# Patient Record
Sex: Female | Born: 1955
Health system: Southern US, Community
[De-identification: ages and names within clinical notes are randomized; demographics above are authoritative.]

## PROBLEM LIST (undated history)

## (undated) DIAGNOSIS — I10 Essential (primary) hypertension: Secondary | ICD-10-CM

## (undated) DIAGNOSIS — Z992 Dependence on renal dialysis: Secondary | ICD-10-CM

## (undated) HISTORY — PX: SHOULDER SURGERY: SHX246

---

## 2007-11-02 ENCOUNTER — Emergency Department (HOSPITAL_COMMUNITY): Admission: EM | Admit: 2007-11-02 | Discharge: 2007-11-02 | Payer: Self-pay | Admitting: Emergency Medicine

## 2007-11-04 ENCOUNTER — Emergency Department (HOSPITAL_COMMUNITY): Admission: EM | Admit: 2007-11-04 | Discharge: 2007-11-04 | Payer: Self-pay | Admitting: Emergency Medicine

## 2010-04-12 ENCOUNTER — Emergency Department (HOSPITAL_BASED_OUTPATIENT_CLINIC_OR_DEPARTMENT_OTHER): Admission: EM | Admit: 2010-04-12 | Discharge: 2010-04-13 | Payer: Self-pay | Admitting: Emergency Medicine

## 2010-07-30 ENCOUNTER — Emergency Department (HOSPITAL_BASED_OUTPATIENT_CLINIC_OR_DEPARTMENT_OTHER)
Admission: EM | Admit: 2010-07-30 | Discharge: 2010-07-30 | Payer: Self-pay | Source: Home / Self Care | Admitting: Emergency Medicine

## 2010-10-20 LAB — URINALYSIS, ROUTINE W REFLEX MICROSCOPIC
Glucose, UA: >1000 mg/dL — AB
Ketones, ur: NEGATIVE mg/dL
Leukocytes, UA: NEGATIVE
Nitrite: NEGATIVE
Protein, ur: 100 mg/dL — AB
pH: 5.5 (ref 5.0–8.0)

## 2010-10-20 LAB — URINE MICROSCOPIC-ADD ON

## 2010-10-20 LAB — COMPREHENSIVE METABOLIC PANEL
ALT: 14 U/L (ref 0–35)
AST: 15 U/L (ref 0–37)
Albumin: 3.6 g/dL (ref 3.5–5.2)
Alkaline Phosphatase: 124 U/L — ABNORMAL HIGH (ref 39–117)
Calcium: 9.1 mg/dL (ref 8.4–10.5)
GFR calc Af Amer: >60 mL/min (ref >60)
Potassium: 4.6 mEq/L (ref 3.5–5.1)
Sodium: 143 mEq/L (ref 135–145)
Total Protein: 6.6 g/dL (ref 6.0–8.3)

## 2010-10-20 LAB — GLUCOSE, CAPILLARY: Glucose-Capillary: 269 mg/dL — ABNORMAL HIGH (ref 70–99)

## 2010-10-23 LAB — CULTURE, ROUTINE-ABSCESS

## 2010-10-28 ENCOUNTER — Emergency Department (INDEPENDENT_AMBULATORY_CARE_PROVIDER_SITE_OTHER): Payer: Medicaid Other

## 2010-10-28 ENCOUNTER — Emergency Department (HOSPITAL_BASED_OUTPATIENT_CLINIC_OR_DEPARTMENT_OTHER)
Admission: EM | Admit: 2010-10-28 | Discharge: 2010-10-28 | Disposition: A | Discharge status: Home / Self Care | Payer: Medicaid Other | Attending: Emergency Medicine | Admitting: Emergency Medicine

## 2010-10-28 DIAGNOSIS — M549 Dorsalgia, unspecified: Secondary | ICD-10-CM

## 2010-10-28 DIAGNOSIS — E119 Type 2 diabetes mellitus without complications: Secondary | ICD-10-CM | POA: Insufficient documentation

## 2010-10-28 DIAGNOSIS — I10 Essential (primary) hypertension: Secondary | ICD-10-CM | POA: Insufficient documentation

## 2010-10-28 LAB — CBC
Hemoglobin: 12.3 g/dL (ref 12.0–15.0)
MCH: 26.2 pg (ref 26.0–34.0)
MCHC: 34.9 g/dL (ref 30.0–36.0)
RDW: 12.6 % (ref 11.5–15.5)

## 2010-10-28 LAB — DIFFERENTIAL
Basophils Absolute: 0.1 10*3/uL (ref 0.0–0.1)
Eosinophils Absolute: 0.1 10*3/uL (ref 0.0–0.7)
Lymphs Abs: 2.3 10*3/uL (ref 0.7–4.0)
Monocytes Absolute: 0.3 10*3/uL (ref 0.1–1.0)
Monocytes Relative: 6 % (ref 3–12)
Neutro Abs: 2.1 10*3/uL (ref 1.7–7.7)

## 2010-10-28 LAB — BASIC METABOLIC PANEL
CO2: 22 mEq/L (ref 19–32)
Calcium: 9.3 mg/dL (ref 8.4–10.5)
Creatinine, Ser: 0.8 mg/dL (ref 0.4–1.2)
GFR calc Af Amer: >60 mL/min (ref >60)
GFR calc non Af Amer: >60 mL/min (ref >60)

## 2010-10-28 LAB — POCT CARDIAC MARKERS

## 2011-04-24 ENCOUNTER — Emergency Department (HOSPITAL_BASED_OUTPATIENT_CLINIC_OR_DEPARTMENT_OTHER)
Admission: EM | Admit: 2011-04-24 | Discharge: 2011-04-25 | Disposition: A | Discharge status: Home / Self Care | Payer: Medicaid Other | Attending: Emergency Medicine | Admitting: Emergency Medicine

## 2011-04-24 ENCOUNTER — Encounter: Payer: Self-pay | Admitting: *Deleted

## 2011-04-24 DIAGNOSIS — L03119 Cellulitis of unspecified part of limb: Secondary | ICD-10-CM

## 2011-04-24 DIAGNOSIS — L02419 Cutaneous abscess of limb, unspecified: Secondary | ICD-10-CM | POA: Insufficient documentation

## 2011-04-24 HISTORY — DX: Essential (primary) hypertension: I10

## 2011-04-24 MED ORDER — LIDOCAINE HCL (PF) 1 % IJ SOLN
10.0000 mL | Freq: Once | INTRAMUSCULAR | Status: AC
  Administered 2011-04-24: 10 mL via INTRADERMAL
  Filled 2011-04-24 (×2): qty 5

## 2011-04-24 NOTE — ED Notes (Signed)
Abscess right lower leg x 2 days.

## 2011-04-25 MED ORDER — SULFAMETHOXAZOLE-TRIMETHOPRIM 800-160 MG PO TABS: 1.0000 | ORAL_TABLET | Freq: Two times a day (BID) | ORAL | Status: AC

## 2011-04-25 NOTE — ED Provider Notes (Signed)
History     CSN: [id number] Arrival date & time: 04/24/2011  9:27 PM   Chief Complaint  Patient presents with  . Abscess     (Include location/radiation/quality/duration/timing/severity/associated sxs/prior treatment) Patient is a 55 y.o. female presenting with abscess. The history is provided by the patient.  Abscess  This is a new problem. The current episode started yesterday. The onset was gradual. The problem occurs continuously. The problem has been rapidly worsening. The abscess is present on the right lower leg. The problem is moderate. The abscess is characterized by redness, painfulness and swelling. It is unknown what she was exposed to. The abscess first occurred at home. Pertinent negatives include no fever and no vomiting. Her past medical history does not include skin abscesses in family. There were no sick contacts. She has received no recent medical care.   H/o abscess and this feels the same as previous lesions.  Started as a pimple and is now larger and more painful to her R anterior shin  Past Medical History  Diagnosis Date  . Diabetes mellitus   . Hypertension      Past Surgical History  Procedure Date  . Cesarean section   . Shoulder surgery     No family history on file.  History  Substance Use Topics  . Smoking status: Never Smoker   . Smokeless tobacco: Not on file  . Alcohol Use: No    OB History    Grav Para Term Preterm Abortions TAB SAB Ect Mult Living                  Review of Systems  Constitutional: Negative for fever and chills.  HENT: Negative for neck pain and neck stiffness.   Eyes: Negative for pain.  Respiratory: Negative for shortness of breath.   Cardiovascular: Negative for chest pain.  Gastrointestinal: Negative for vomiting and abdominal pain.  Genitourinary: Negative for dysuria.  Musculoskeletal: Negative for back pain.  Skin: Positive for wound. Negative for rash.  Neurological: Negative for headaches.  All other  systems reviewed and are negative.    Allergies  Oxycontin  Home Medications   Current Outpatient Rx  Name Route Sig Dispense Refill  . LISINOPRIL 10 MG PO TABS Oral Take 10 mg by mouth daily.      Marland Kitchen METFORMIN HCL 500 MG PO TABS Oral Take 500 mg by mouth 2 (two) times daily with a meal.        Physical Exam    BP 157/69  Pulse 71  Temp(Src) 98.1 F (36.7 C) (Oral)  Resp 18  SpO2 98%  Physical Exam  Constitutional: She is oriented to person, place, and time. She appears well-developed and well-nourished.  HENT:  Head: Normocephalic and atraumatic.  Eyes: Conjunctivae and EOM are normal. Pupils are equal, round, and reactive to light.  Neck: Full passive range of motion without pain. Neck supple. No thyromegaly present.       No meningismus  Cardiovascular: Normal rate, regular rhythm, S1 normal, S2 normal and intact distal pulses.   Pulmonary/Chest: Effort normal and breath sounds normal.  Abdominal: Soft. Bowel sounds are normal. There is no tenderness. There is no CVA tenderness.  Musculoskeletal: Normal range of motion.  Neurological: She is alert and oriented to person, place, and time. She has normal strength. No cranial nerve deficit or sensory deficit. She displays a negative Romberg sign. GCS eye subscore is 4. GCS verbal subscore is 5. GCS motor subscore is 6.  Normal Gait  Skin: Skin is warm and dry. No cyanosis. Nails show no clubbing.       RLE: tender area to distal anterior shin with mild erythema and pointing with underlying fluctuance, no streaking erythema  Psychiatric: She has a normal mood and affect. Her speech is normal and behavior is normal.    ED Course  INCISION AND DRAINAGE Date/Time: 04/25/2011 12:28 AM Performed by: Sunnie Nielsen Authorized by: Sunnie Nielsen Consent: Verbal consent obtained. Risks and benefits: risks, benefits and alternatives were discussed Consent given by: patient Patient understanding: patient states understanding of  the procedure being performed Patient consent: the patient's understanding of the procedure matches consent given Procedure consent: procedure consent matches procedure scheduled Required items: required blood products, implants, devices, and special equipment available Patient identity confirmed: verbally with patient Time out: Immediately prior to procedure a "time out" was called to verify the correct patient, procedure, equipment, support staff and site/side marked as required. Type: abscess Location: R anterior distal tibia. Anesthesia: local infiltration Local anesthetic: lidocaine 1% without epinephrine Anesthetic total: 1 ml Risk factor: underlying major vessel and underlying major nerve Scalpel size: 11 Needle gauge: 22 Incision type: single straight Complexity: complex Drainage: purulent Drainage amount: moderate Wound treatment: wound left open Patient tolerance: Patient tolerated the procedure well with no immediate complications.    Results for orders placed during the hospital encounter of 10/28/10  DIFFERENTIAL      Component Value Range   Neutrophils Relative 42 (*) 43 - 77 (%)   Lymphocytes Relative 48 (*) 12 - 46 (%)   Monocytes Relative 6  3 - 12 (%)   Eosinophils Relative 3  0 - 5 (%)   Basophils Relative 1  0 - 1 (%)   Neutro Abs 2.1  1.7 - 7.7 (K/uL)   Lymphs Abs 2.3  0.7 - 4.0 (K/uL)   Monocytes Absolute 0.3  0.1 - 1.0 (K/uL)   Eosinophils Absolute 0.1  0.0 - 0.7 (K/uL)   Basophils Absolute 0.1  0.0 - 0.1 (K/uL)   Smear Review MORPHOLOGY UNREMARKABLE    CBC      Component Value Range   WBC 4.9  4.0 - 10.5 (K/uL)   RBC 4.70  3.87 - 5.11 (MIL/uL)   Hemoglobin 12.3  12.0 - 15.0 (g/dL)   HCT 16.1 (*) 09.6 - 46.0 (%)   MCV 74.9 (*) 78.0 - 100.0 (fL)   MCH 26.2  26.0 - 34.0 (pg)   MCHC 34.9  30.0 - 36.0 (g/dL)   RDW 04.5  40.9 - 81.1 (%)   Platelets 268  150 - 400 (K/uL)  BASIC METABOLIC PANEL      Component Value Range   Sodium 137  135 - 145  (mEq/L)   Potassium 4.6  3.5 - 5.1 (mEq/L)   Chloride 103  96 - 112 (mEq/L)   CO2 22  19 - 32 (mEq/L)   Glucose, Bld 360 (*) 70 - 99 (mg/dL)   BUN 16  6 - 23 (mg/dL)   Creatinine, Ser .8  0.4 - 1.2 (mg/dL)   Calcium 9.3  8.4 - 91.4 (mg/dL)   GFR calc non Af Amer >60  >60 (mL/min)   GFR calc Af Amer    >60 (mL/min)   Value: >60            The eGFR has been calculated     using the MDRD equation.     This calculation has not been     validated  in all clinical     situations.     eGFR's persistently     <60 mL/min signify     possible Chronic Kidney Disease.  D-DIMER, QUANTITATIVE      Component Value Range   D-Dimer, Quant    0.00 - 0.48 (ug/mL-FEU)   Value: 0.29            AT THE INHOUSE ESTABLISHED CUTOFF     VALUE OF 0.48 ug/mL FEU,     THIS ASSAY HAS BEEN DOCUMENTED     IN THE LITERATURE TO HAVE     A SENSITIVITY AND NEGATIVE     PREDICTIVE VALUE OF AT LEAST     98 TO 99%.  THE TEST RESULT     SHOULD BE CORRELATED WITH     AN ASSESSMENT OF THE CLINICAL     PROBABILITY OF DVT / VTE.  POCT CARDIAC MARKERS      Component Value Range   Myoglobin, poc 144  12 - 200 (ng/mL)   CKMB, poc 3.1  1.0 - 8.0 (ng/mL)   Troponin i, poc <0.05  0.00 - 0.09 (ng/mL)   Comment       Value:            TROPONIN VALUES IN THE RANGE     OF 0.00-0.09 ng/mL SHOW     NO INDICATION OF     MYOCARDIAL INJURY.                PERSISTENTLY INCREASED TROPONIN     VALUES IN THE RANGE OF 0.10-0.24     ng/mL CAN BE SEEN IN:           -UNSTABLE ANGINA           -CONGESTIVE HEART FAILURE           -MYOCARDITIS           -CHEST TRAUMA           -ARRYHTHMIAS           -LATE PRESENTING MI           -COPD       CLINICAL FOLLOW-UP RECOMMENDED.                TROPONIN VALUES >=0.25 ng/mL     INDICATE POSSIBLE MYOCARDIAL     ISCHEMIA. SERIAL TESTING     RECOMMENDED.   No results found.   No diagnosis found.   MDM R leg abscess with I/D as above. RX provided with d/c precautions and plan  outpatient management, return for fever, vomiting or worsening abscess       Sunnie Nielsen, MD 04/25/11 [phone]

## 2011-05-04 LAB — CULTURE, ROUTINE-ABSCESS

## 2012-03-06 ENCOUNTER — Encounter (HOSPITAL_BASED_OUTPATIENT_CLINIC_OR_DEPARTMENT_OTHER): Payer: Self-pay | Admitting: Emergency Medicine

## 2012-03-06 ENCOUNTER — Emergency Department (HOSPITAL_BASED_OUTPATIENT_CLINIC_OR_DEPARTMENT_OTHER)
Admission: EM | Admit: 2012-03-06 | Discharge: 2012-03-06 | Disposition: A | Discharge status: Home / Self Care | Payer: Medicaid Other | Attending: Emergency Medicine | Admitting: Emergency Medicine

## 2012-03-06 DIAGNOSIS — R21 Rash and other nonspecific skin eruption: Secondary | ICD-10-CM

## 2012-03-06 DIAGNOSIS — E119 Type 2 diabetes mellitus without complications: Secondary | ICD-10-CM | POA: Insufficient documentation

## 2012-03-06 DIAGNOSIS — I10 Essential (primary) hypertension: Secondary | ICD-10-CM | POA: Insufficient documentation

## 2012-03-06 DIAGNOSIS — L299 Pruritus, unspecified: Secondary | ICD-10-CM | POA: Insufficient documentation

## 2012-03-06 MED ORDER — HYDROXYZINE HCL 25 MG PO TABS: 25.0000 mg | ORAL_TABLET | Freq: Three times a day (TID) | ORAL | Status: AC | PRN

## 2012-03-06 NOTE — ED Provider Notes (Signed)
History     CSN: [id number]  Arrival date & time 03/06/12  4098   First MD Initiated Contact with Patient 03/06/12 [phone]      Chief Complaint  Patient presents with  . Rash    (Consider location/radiation/quality/duration/timing/severity/associated sxs/prior treatment) HPI Patient is a 73 your old female who presents today complaining of rash over the past month. She has seen her primary care physician for this. Patient describes the rash as being itchy. She has small raised lesions notable over the dorsum of her feet bilaterally as well as in scattered distribution over the bilateral lower legs, bilateral upper arms, and upper chest wall. These are blanching and patient reports terrible itching with these. She has tried Caladryl lotion without relief. Patient also was given steroids as well as steroid cream for her primary care doctor without relief. She has not been seen again since that time. Patient is tearful as she reports she has been unable to sleep because of the itching. She took Benadryl once with no relief. She denies shortness of breath, cough, fevers, headache, neck pain, GI symptoms, GU symptoms, or other concerns. There are no other associated or modifying factors. Past Medical History  Diagnosis Date  . Diabetes mellitus   . Hypertension     Past Surgical History  Procedure Date  . Cesarean section   . Shoulder surgery     History reviewed. No pertinent family history.  History  Substance Use Topics  . Smoking status: Never Smoker   . Smokeless tobacco: Not on file  . Alcohol Use: No    OB History    Grav Para Term Preterm Abortions TAB SAB Ect Mult Living                  Review of Systems  Constitutional: Negative.   HENT: Negative.   Eyes: Negative.   Respiratory: Negative.   Cardiovascular: Negative.   Gastrointestinal: Negative.   Genitourinary: Negative.   Musculoskeletal: Negative.   Skin: Positive for rash.  Neurological: Negative.     Hematological: Negative.   Psychiatric/Behavioral: Negative.   All other systems reviewed and are negative.    Allergies  Oxycodone hcl er  Home Medications   Current Outpatient Rx  Name Route Sig Dispense Refill  . HYDROXYZINE HCL 25 MG PO TABS Oral Take 1 tablet (25 mg total) by mouth every 8 (eight) hours as needed for itching. 12 tablet 0  . LISINOPRIL 10 MG PO TABS Oral Take 10 mg by mouth daily.      Marland Kitchen METFORMIN HCL 500 MG PO TABS Oral Take 500 mg by mouth 2 (two) times daily with a meal.        BP 145/64  Pulse 72  Temp 98.2 F (36.8 C) (Oral)  Resp 16  Ht 5\' 6"  (1.676 m)  Wt 193 lb (87.544 kg)  BMI 31.15 kg/m2  SpO2 100%  Physical Exam  Nursing note and vitals reviewed. GEN: Well-developed, well-nourished female in no distress HEENT: Atraumatic, normocephalic.  EYES: PERRLA BL, no scleral icterus. NECK: Trachea midline, no meningismus, full range of motion Neuro: cranial nerves grossly 2-12 intact, no abnormalities of strength or sensation, A and O x 3 MSK: Patient moves all 4 extremities symmetrically, no deformity, edema, or injury noted Skin: Patient with 1 mm in diameter raised slightly erythematous lesions over the dorsum of the feet bilaterally bilateral lower extremities below the knees and a more diffuse distribution, as well as scattered of the bilateral upper extremities  and right side upper chest wall. These or not in distribution consistent with clear contact dermatitis. Lesions are small and discrete with no erythema to suggest cellulitis. These are blanching and neither purpuric nor petechiae though there are some lesions have surrounding bruising. Patient reports that these have healed.  Psych: Patient is somewhat tearful.   ED Course  Procedures (including critical care time)  Labs Reviewed - No data to display No results found.   1. Rash and nonspecific skin eruption   2. Pruritus       MDM  Patient was evaluated by myself. Based on her  symptoms I did not feel that laboratory testing was necessary today. Patient's chief complaint was truly itching. She had some areas that appeared to have associated bruising over the bilateral lower extremities but these appeared to be bruises rather than petechiae or purpura. Patient was hemodynamically stable. She had driven herself here today and preferred to be able to drive herself home. A prescription was given for Atarax. Patient will try this. She is a diabetic and is already had one course of steroids without resolution. She did not have any symptoms to suggest Stevens-Johnson syndrome, TEN, or other emergent skin conditions today. I recommended that she contact her primary care physician tomorrow regarding whether they wanted to initiate a second round of steroids. Patient did not require any laboratory testing today. She was discharged in good condition with a prescription for hydroxyzine.        Cyndra Numbers, MD 03/06/12 1119

## 2012-03-06 NOTE — ED Notes (Signed)
Pt c/o rash to BLE, BUE & chest x 1 mo; OTC creams not helping

## 2016-04-02 ENCOUNTER — Emergency Department (HOSPITAL_BASED_OUTPATIENT_CLINIC_OR_DEPARTMENT_OTHER)
Admission: EM | Admit: 2016-04-02 | Discharge: 2016-04-02 | Disposition: A | Discharge status: Home / Self Care | Payer: Medicaid Other | Attending: Emergency Medicine | Admitting: Emergency Medicine

## 2016-04-02 ENCOUNTER — Emergency Department (HOSPITAL_BASED_OUTPATIENT_CLINIC_OR_DEPARTMENT_OTHER): Payer: Medicaid Other

## 2016-04-02 ENCOUNTER — Encounter (HOSPITAL_BASED_OUTPATIENT_CLINIC_OR_DEPARTMENT_OTHER): Payer: Self-pay | Admitting: *Deleted

## 2016-04-02 DIAGNOSIS — Y939 Activity, unspecified: Secondary | ICD-10-CM | POA: Insufficient documentation

## 2016-04-02 DIAGNOSIS — Y929 Unspecified place or not applicable: Secondary | ICD-10-CM | POA: Insufficient documentation

## 2016-04-02 DIAGNOSIS — S76112A Strain of left quadriceps muscle, fascia and tendon, initial encounter: Secondary | ICD-10-CM | POA: Insufficient documentation

## 2016-04-02 DIAGNOSIS — I10 Essential (primary) hypertension: Secondary | ICD-10-CM | POA: Insufficient documentation

## 2016-04-02 DIAGNOSIS — Z79899 Other long term (current) drug therapy: Secondary | ICD-10-CM | POA: Insufficient documentation

## 2016-04-02 DIAGNOSIS — Z7984 Long term (current) use of oral hypoglycemic drugs: Secondary | ICD-10-CM | POA: Insufficient documentation

## 2016-04-02 DIAGNOSIS — Y999 Unspecified external cause status: Secondary | ICD-10-CM | POA: Insufficient documentation

## 2016-04-02 DIAGNOSIS — W010XXA Fall on same level from slipping, tripping and stumbling without subsequent striking against object, initial encounter: Secondary | ICD-10-CM | POA: Insufficient documentation

## 2016-04-02 DIAGNOSIS — E119 Type 2 diabetes mellitus without complications: Secondary | ICD-10-CM | POA: Insufficient documentation

## 2016-04-02 HISTORY — DX: Dependence on renal dialysis: Z99.2

## 2016-04-02 MED ORDER — ONDANSETRON 4 MG PO TBDP: 4.0000 mg | ORAL_TABLET | Freq: Once | ORAL | Status: DC

## 2016-04-02 MED ORDER — OXYCODONE HCL 5 MG PO TABS: 5.0000 mg | ORAL_TABLET | Freq: Once | ORAL | Status: DC

## 2016-04-02 MED ORDER — HYDROMORPHONE HCL 2 MG PO TABS
1.0000 mg | ORAL_TABLET | Freq: Once | ORAL | Status: DC
  Filled 2016-04-02: qty 1

## 2016-04-02 MED ORDER — HYDROMORPHONE HCL 2 MG PO TABS: 1.0000 mg | ORAL_TABLET | Freq: Four times a day (QID) | ORAL | 0 refills | Status: AC | PRN

## 2016-04-02 MED ORDER — HYDROMORPHONE HCL 1 MG/ML IJ SOLN
0.5000 mg | Freq: Once | INTRAMUSCULAR | Status: AC
  Administered 2016-04-02: 0.5 mg via SUBCUTANEOUS
  Filled 2016-04-02: qty 1

## 2016-04-02 MED ORDER — ACETAMINOPHEN 500 MG PO TABS
1000.0000 mg | ORAL_TABLET | Freq: Once | ORAL | Status: AC
  Administered 2016-04-02: 1000 mg via ORAL
  Filled 2016-04-02: qty 2

## 2016-04-02 MED FILL — HYDROmorphone HCL 2 MG TABS: 2 | 2 days supply | Qty: 6 | Fill #0

## 2016-04-02 NOTE — ED Triage Notes (Signed)
She tripped and fell this am. Pain to both knees but she landed on her left knee.

## 2016-04-02 NOTE — ED Provider Notes (Signed)
[facility] Note   CSN: [id number] Arrival date & time: 04/02/16  1126     History   Chief Complaint Chief Complaint  Patient presents with  . Fall    HPI Anna Buckley is a 60 y.o. female.  The history is provided by the patient.  Fall  This is a new problem. The current episode started 1 to 2 hours ago. The problem occurs constantly. The problem has not changed since onset.Pertinent negatives include no chest pain and no abdominal pain. Nothing aggravates the symptoms. The symptoms are relieved by ice. She has tried a cold compress for the symptoms. The treatment provided moderate relief.    Past Medical History:  Diagnosis Date  . Diabetes mellitus   . Dialysis patient (HCC)   . Hypertension     There are no active problems to display for this patient.   Past Surgical History:  Procedure Laterality Date  . CESAREAN SECTION    . SHOULDER SURGERY      OB History    No data available       Home Medications    Prior to Admission medications   Medication Sig Start Date End Date Taking? Authorizing Provider  GLIPIZIDE PO Take by mouth.   Yes Historical Provider, MD  lisinopril (PRINIVIL,ZESTRIL) 10 MG tablet Take 10 mg by mouth daily.      Historical Provider, MD  metFORMIN (GLUCOPHAGE) 500 MG tablet Take 500 mg by mouth 2 (two) times daily with a meal.      Historical Provider, MD    Family History No family history on file.  Social History Social History  Substance Use Topics  . Smoking status: Never Smoker  . Smokeless tobacco: Never Used  . Alcohol use No     Allergies   Oxycodone hcl   Review of Systems Review of Systems  Cardiovascular: Negative for chest pain.  Gastrointestinal: Negative for abdominal pain.  All other systems reviewed and are negative.    Physical Exam Updated Vital Signs BP (!) 150/49   Pulse 60   Temp 98.3 F (36.8 C) (Oral)   Resp 18   Ht 5' 5.5" (1.664 m)   Wt 188 lb (85.3 kg)    SpO2 99%   BMI 30.81 kg/m   Physical Exam  Constitutional: She is oriented to person, place, and time. She appears well-developed and well-nourished. No distress.  HENT:  Head: Normocephalic.  Eyes: Conjunctivae are normal.  Neck: Neck supple. No tracheal deviation present.  Cardiovascular: Normal rate and regular rhythm.   Pulmonary/Chest: Effort normal. No respiratory distress.  Abdominal: Soft. She exhibits no distension.  Musculoskeletal:       Left knee: She exhibits decreased range of motion (2/2 pain), swelling and effusion. She exhibits no ecchymosis, no deformity, no LCL laxity, normal patellar mobility and no MCL laxity. Tenderness found.  0/5 strength of knee extension  Neurological: She is alert and oriented to person, place, and time.  Skin: Skin is warm and dry.  Psychiatric: She has a normal mood and affect.     ED Treatments / Results  Labs (all labs ordered are listed, but only abnormal results are displayed) Labs Reviewed - No data to display  EKG  EKG Interpretation None       Radiology No results found.  Procedures Procedures (including critical care time) Emergency Focused Ultrasound Exam Limited Ultrasound of Soft Tissue   Performed and interpreted by Dr. Clydene PughKnott Indication: evaluation for infection or foreign  body Transverse and Sagittal views of left anterior thigh are obtained in real time for the purposes of evaluation of skin and underlying soft tissues.  Findings: + homogeneous fluid collection with layering, floating muscle body Interpretation: complete quadriceps rupture Images archived electronically.  CPT Codes:  Lower extremity [id number]  Other soft tissue [id number]   Medications Ordered in ED Medications  acetaminophen (TYLENOL) tablet 1,000 mg (1,000 mg Oral Given 04/02/16 1304)  HYDROmorphone (DILAUDID) injection 0.5 mg (0.5 mg Subcutaneous Given 04/02/16 1424)     Initial Impression / Assessment and Plan / ED Course  I have  reviewed the triage vital signs and the nursing notes.  Pertinent labs & imaging results that were available during my care of the patient were reviewed by me and considered in my medical decision making (see chart for details).  Clinical Course   60 y.o. female presents with Fall from standing onto her left knee. She has mild pain in her right knee and her bilateral arms but has full range of motion and no swelling or other deformities of those extremities. Her left knee is extremely painful, swollen proximal to the patella and has loss of the extensor mechanism. No fractures noted on x-ray but large hematoma and floating muscle body at is consistent with quadriceps rupture on bedside ultrasound. Patient will be placed in knee immobilizer on crutches and is to be nonweightbearing in full extension until able to follow-up with first available orthopedic appointment. Unable to tolerate oxycodone previously, I do not want her taking comminution therapy with acetaminophen and morphine would be a poor choice with her dialysis dependent status so low dose oral Dilaudid was provided for a short course of breakthrough pain at home and I recommended scheduled Tylenol along with ice, elevation, rest and compression pending surgical evaluation.  Final Clinical Impressions(s) / ED Diagnoses   Final diagnoses:  Rupture of left quadriceps muscle, initial encounter    New Prescriptions Discharge Medication List as of 04/02/2016  2:22 PM    START taking these medications   Details  HYDROmorphone (DILAUDID) 2 MG tablet Take 0.5 tablets (1 mg total) by mouth every 6 (six) hours as needed for severe pain., Starting Thu 04/02/2016, Print         Lyndal Pulleyaniel Rook Maue, MD 04/02/16 [phone]

## 2017-02-22 IMAGING — CR DG KNEE COMPLETE 4+V*L*
6 series · 6 of 6 positions shown · non-contrast
Comparison: None.

CLINICAL DATA: Knee pain and swelling since fall.

EXAM:
LEFT KNEE - COMPLETE 4+ VIEW

[t knee lat left (1 of 3)]
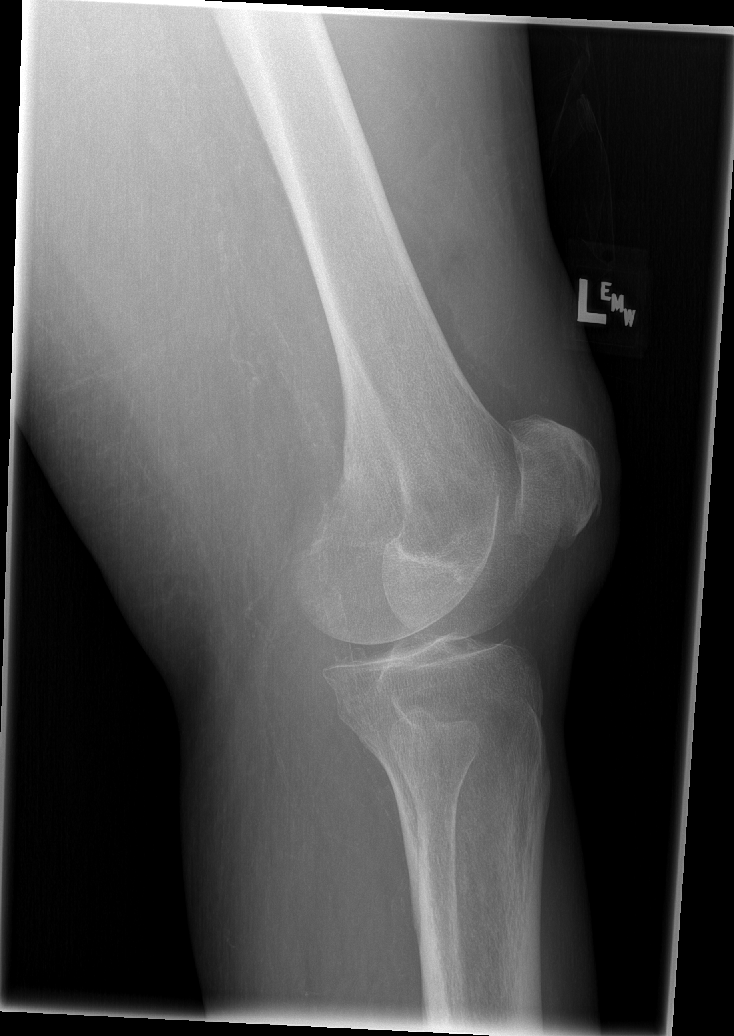

[t knee lat left (2 of 3)]
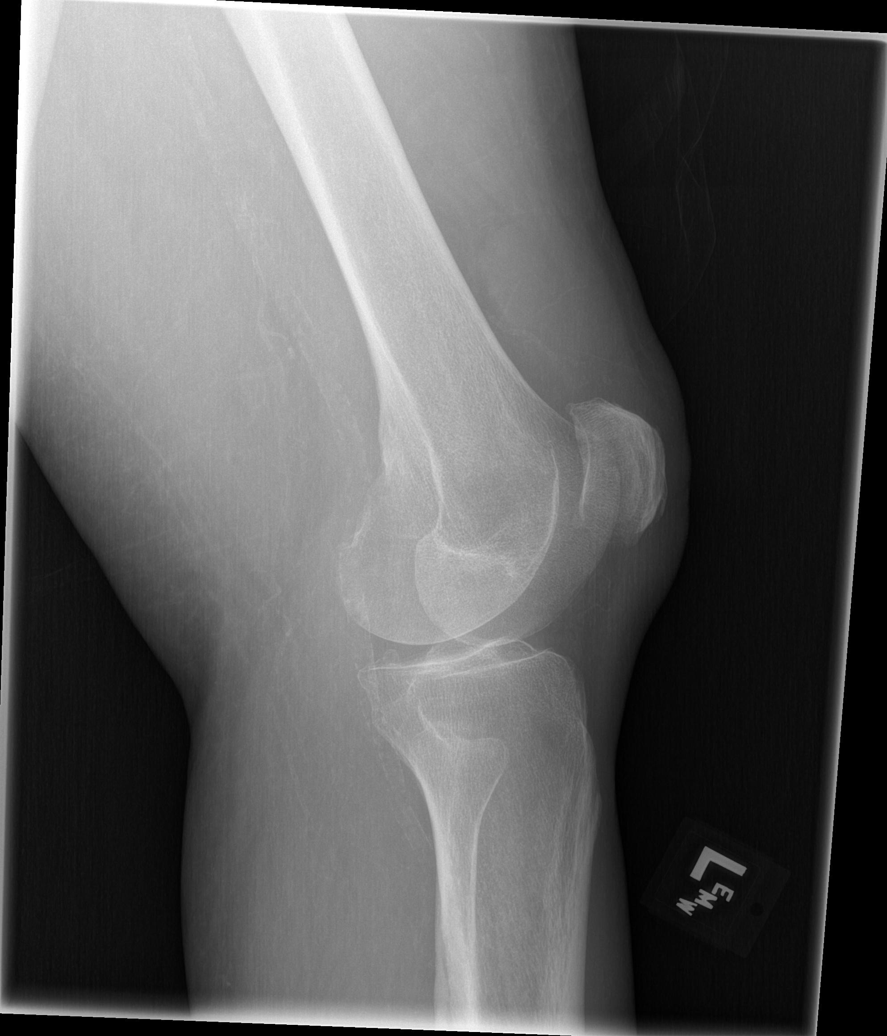

[t knee ap left]
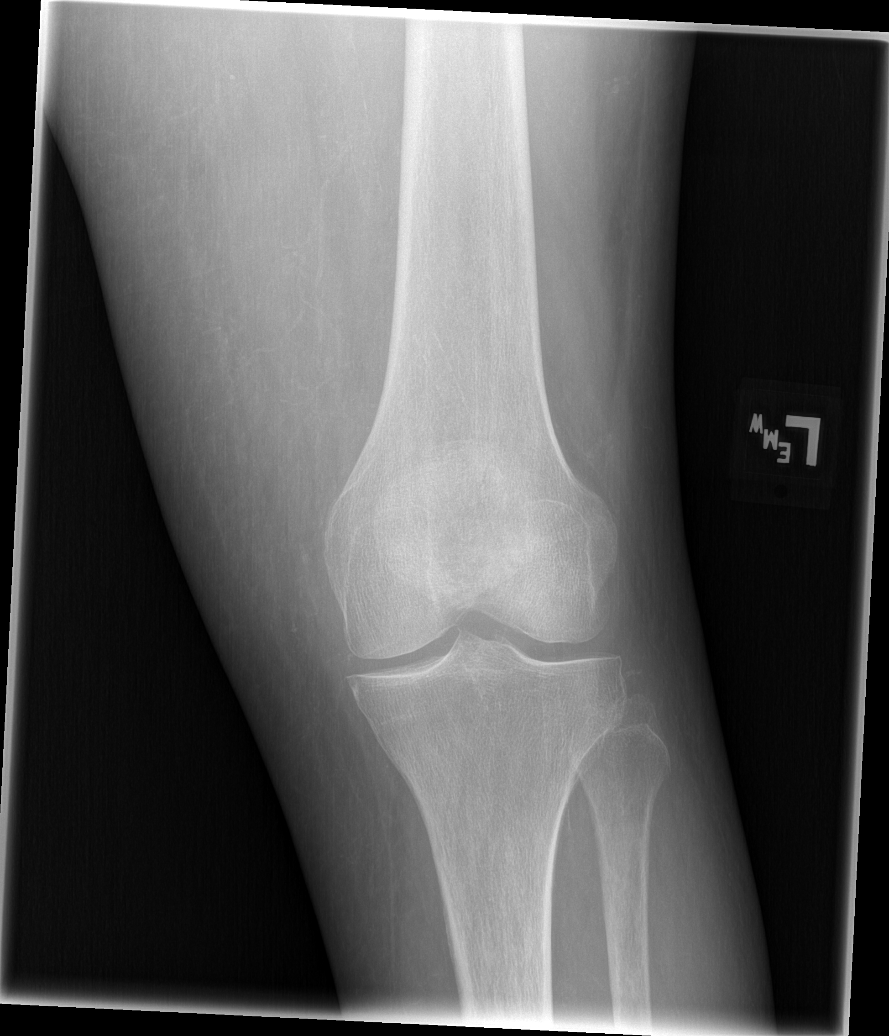

[t knee oblique left (1 of 2)]
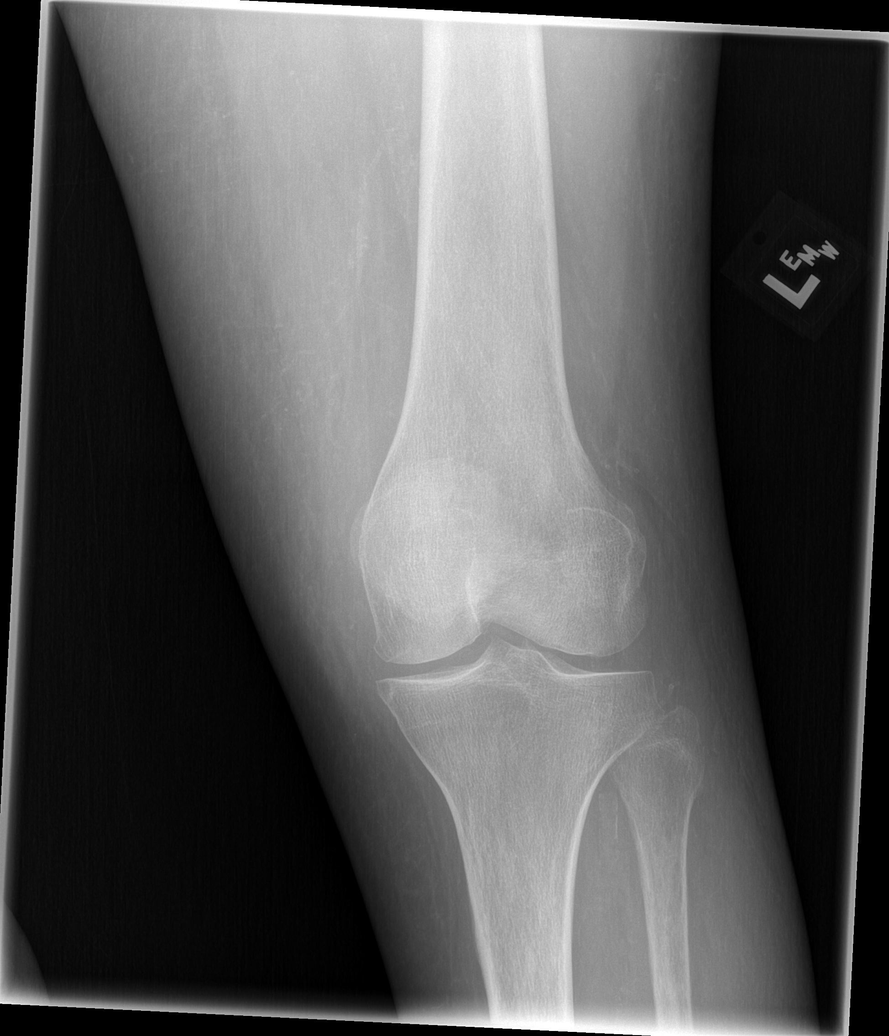

[t knee oblique left (2 of 2)]
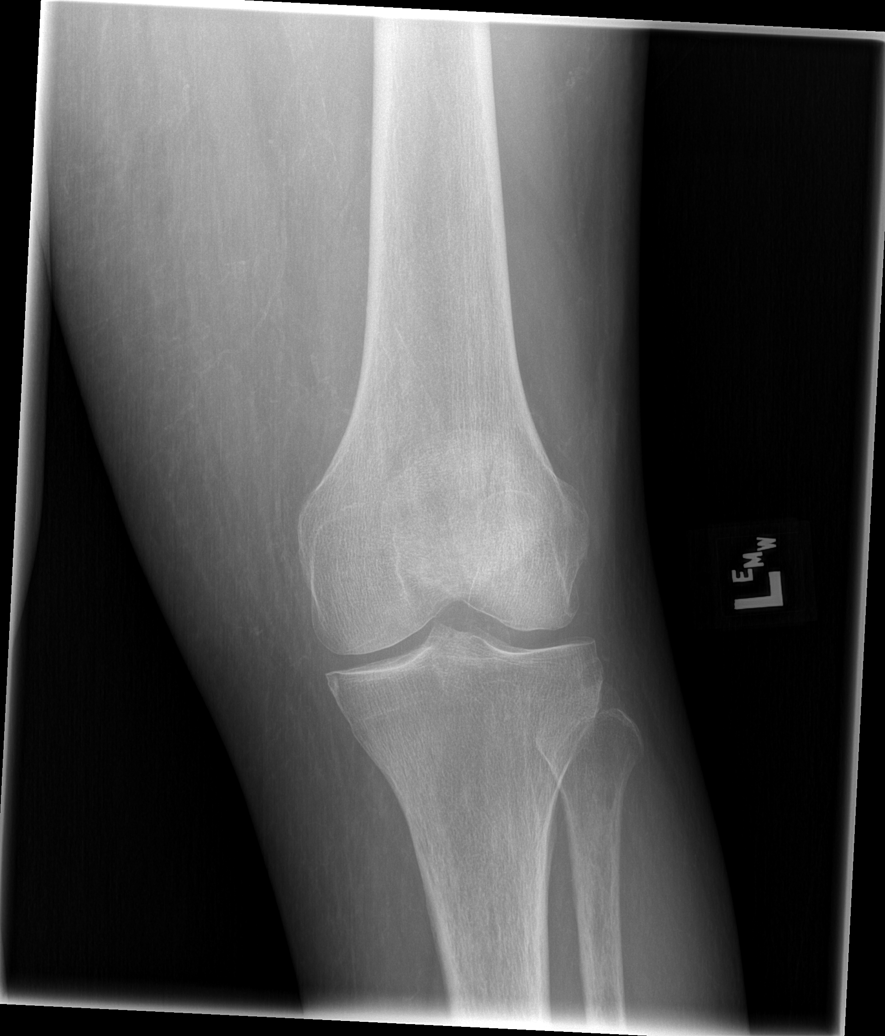

[t knee lat left (3 of 3)]
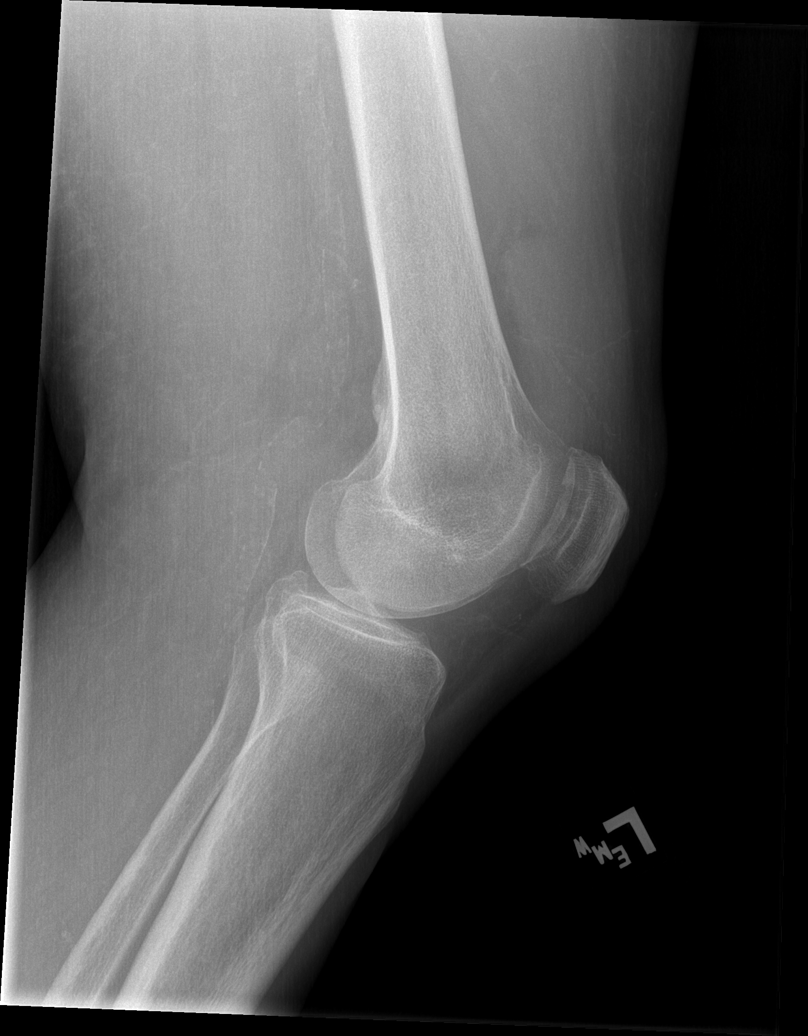

[6 of 6 positions shown; findings below may reference images not displayed]

FINDINGS: There is a moderate suprapatellar effusion. Vascular calcifications
are identified. Mild degenerative changes in the patellofemoral
compartment in the medial compartment. No fractures are seen.
IMPRESSION: No fracture identified.  Suprapatellar effusion.

## 2019-10-08 ENCOUNTER — Ambulatory Visit: Payer: Self-pay

## 2021-04-10 DEATH — deceased
